# Patient Record
Sex: Female | Born: 1955 | Race: White | Hispanic: No | Smoking: Never smoker
Health system: Southern US, Community
[De-identification: ages and names within clinical notes are randomized; demographics above are authoritative.]

## PROBLEM LIST (undated history)

## (undated) DIAGNOSIS — I10 Essential (primary) hypertension: Secondary | ICD-10-CM

## (undated) DIAGNOSIS — J45909 Unspecified asthma, uncomplicated: Secondary | ICD-10-CM

## (undated) HISTORY — PX: OTHER SURGICAL HISTORY: SHX169

## (undated) HISTORY — PX: TUBAL LIGATION: SHX77

---

## 2016-10-09 ENCOUNTER — Emergency Department: Payer: Medicaid Other

## 2016-10-09 ENCOUNTER — Other Ambulatory Visit: Payer: Self-pay

## 2016-10-09 ENCOUNTER — Emergency Department
Admission: EM | Admit: 2016-10-09 | Discharge: 2016-10-10 | Disposition: A | Discharge status: Home / Self Care | Payer: Medicaid Other | Attending: Emergency Medicine | Admitting: Emergency Medicine

## 2016-10-09 DIAGNOSIS — R21 Rash and other nonspecific skin eruption: Secondary | ICD-10-CM | POA: Diagnosis present

## 2016-10-09 DIAGNOSIS — L03115 Cellulitis of right lower limb: Secondary | ICD-10-CM | POA: Diagnosis not present

## 2016-10-09 DIAGNOSIS — L03116 Cellulitis of left lower limb: Secondary | ICD-10-CM | POA: Diagnosis not present

## 2016-10-09 DIAGNOSIS — L03119 Cellulitis of unspecified part of limb: Secondary | ICD-10-CM

## 2016-10-09 LAB — BASIC METABOLIC PANEL
Anion gap: 11 (ref 5–15)
BUN: 15 mg/dL (ref 6–20)
CALCIUM: 9.4 mg/dL (ref 8.9–10.3)
CO2: 27 mmol/L (ref 22–32)
CREATININE: 0.96 mg/dL (ref 0.44–1.00)
Chloride: 98 mmol/L — ABNORMAL LOW (ref 101–111)
GFR calc non Af Amer: >60 mL/min (ref >60)
Glucose, Bld: 129 mg/dL — ABNORMAL HIGH (ref 65–99)
Potassium: 3.1 mmol/L — ABNORMAL LOW (ref 3.5–5.1)
SODIUM: 136 mmol/L (ref 135–145)

## 2016-10-09 LAB — CBC
HCT: 36.3 % (ref 35.0–47.0)
Hemoglobin: 12.5 g/dL (ref 12.0–16.0)
MCH: 30.8 pg (ref 26.0–34.0)
MCHC: 34.4 g/dL (ref 32.0–36.0)
MCV: 89.5 fL (ref 80.0–100.0)
PLATELETS: 251 10*3/uL (ref 150–440)
RBC: 4.06 MIL/uL (ref 3.80–5.20)
RDW: 13.5 % (ref 11.5–14.5)
WBC: 7.4 10*3/uL (ref 3.6–11.0)

## 2016-10-09 LAB — TROPONIN I

## 2016-10-09 MED ORDER — DEXTROSE 5 % IV SOLN
1.0000 g | Freq: Once | INTRAVENOUS | Status: AC
  Administered 2016-10-09: 1 g via INTRAVENOUS
  Filled 2016-10-09: qty 10

## 2016-10-09 MED ORDER — MORPHINE SULFATE (PF) 2 MG/ML IV SOLN
2.0000 mg | Freq: Once | INTRAVENOUS | Status: AC
  Administered 2016-10-09: 2 mg via INTRAVENOUS
  Filled 2016-10-09: qty 1

## 2016-10-09 NOTE — ED Notes (Signed)
Pt has swelling and redness to both lower legs.  No itching.   Sx for 2 days.  Pt reports unsure if bug bites to ankles.  Pt alert.

## 2016-10-09 NOTE — ED Triage Notes (Signed)
Pt ambulatory to triage with noted limping gait. Pt offered a wheelchair but did not want one. Pt reports she has what she thinks is a rash to her bilateral lower legs that she first noticed yesterday. Pt reports the areas felt warm to the touch yesterday but not now. Pt also reports swelling to her legs. Pt does take a diuretic. Pt now also reporting shortness of breath with exertion and chest pain that is mid sternal with no radiation but causes increased shortness of breath, nausea and diaphoresis when it occurs. Pt talking in full and complete sentences at this time with no distress, Pedal pulses palpable. No pitting edema noted to BLE.

## 2016-10-09 NOTE — ED Notes (Signed)
Report off to allison rn  

## 2016-10-09 NOTE — ED Notes (Signed)
Cellulitis outlined with skin marker. 

## 2016-10-09 NOTE — ED Notes (Signed)
Explained to pt the reason for offering a wheelchair related to her fall risk and now her co of chest pain. Pt agreeable to get in wheelchair at this time. Yellow arm band placed on pt.

## 2016-10-09 NOTE — ED Provider Notes (Addendum)
[facility] Emergency Department Provider Note   First MD Initiated Contact with Patient 10/09/16 2311     (approximate)  I have reviewed the triage vital signs and the nursing notes.   HISTORY  Chief Complaint Rash; Leg Swelling; Knee Pain; and Chest Pain    HPI Christina Guzman is a 61 y.o. female presents to the emergency department with 2 day history of bilateral lower extremity pain swelling and redness noted on the medial aspect of both legs. Patient states that the area was initially warm to touch. Patient denies any fever no nausea or vomiting. Patient denies any chest pain before arrival to the emergency department she also denied any dyspnea. Patient states while in triage when she was unable to find her IV she "became "very anxious" and at that time started experiencing some shortness of breath and chest discomfort which she voiced in triage. Patient states that she has a history of "panic attacks" and states that this was consistent with previous panic attacks. Patient states that following obtaining ID chest pain and shortness of breath resolved.   Past medical history "Enlarged heart" There are no active problems to display for this patient.   Past surgical history None  Prior to Admission medications   Not on File    Allergies Tylenol [acetaminophen]  No family history on file.  Social History Social History  Substance Use Topics  . Smoking status: Not on file  . Smokeless tobacco: Not on file  . Alcohol use Not on file    Review of Systems Constitutional: No fever/chills Eyes: No visual changes. ENT: No sore throat. Cardiovascular: Denies chest pain. Respiratory: Denies shortness of breath. Gastrointestinal: No abdominal pain.  No nausea, no vomiting.  No diarrhea.  No constipation. Genitourinary: Negative for dysuria. Musculoskeletal: Negative for neck pain.  Negative for back pain.positive for bilateral lower external pain and  swelling redness Integumentary: Negative for rash. Neurological: Negative for headaches, focal weakness or numbness.   ____________________________________________   PHYSICAL EXAM:  VITAL SIGNS: ED Triage Vitals  Enc Vitals Group     BP 10/09/16 1950 123/63     Pulse Rate 10/09/16 1950 93     Resp 10/09/16 1950 18     Temp 10/09/16 1950 98.3 F (36.8 C)     Temp Source 10/09/16 1950 Oral     SpO2 10/09/16 1950 98 %     Weight 10/09/16 1951 92.8 kg (204 lb 8 oz)     Height 10/09/16 1951 1.613 m (5' 3.5")     Head Circumference --      Peak Flow --      Pain Score 10/09/16 1950 10     Pain Loc --      Pain Edu? --      Excl. in GC? --     Constitutional: Alert and oriented. Well appearing and in no acute distress. Eyes: Conjunctivae are normal. PERRL. EOMI. Head: Atraumatic. Nose: No congestion/rhinnorhea. Mouth/Throat: Mucous membranes are moist.  Oropharynx non-erythematous. Neck: No stridor Cardiovascular: Normal rate, regular rhythm. Good peripheral circulation. Grossly normal heart sounds. Respiratory: Normal respiratory effort.  No retractions. Lungs CTAB. Gastrointestinal: Soft and nontender. No distention.  Musculoskeletal: No lower extremity tenderness nor edema. No gross deformities of extremities. Neurologic:  Normal speech and language. No gross focal neurologic deficits are appreciated.  Skin:  blanchingerythema noted medial aspect bilateral lower extremity approximate 15 x 8 cm. Area hot to touch Psychiatric: Mood and affect are normal. Speech and  behavior are normal.  ____________________________________________   LABS (all labs ordered are listed, but only abnormal results are displayed)  Labs Reviewed  BASIC METABOLIC PANEL - Abnormal; Notable for the following:       Result Value   Potassium 3.1 (*)    Chloride 98 (*)    Glucose, Bld 129 (*)    All other components within normal limits  CBC  TROPONIN I  TROPONIN I    ____________________________________________  EKG  ED ECG REPORT I, Watson N Christian Treadway, the attending physician, personally viewed and interpreted this ECG.   Date: 10/10/2016  EKG Time: 8:01 PM  Rate: 96  Rhythm: normal sinus rhythm  Axis: right axis deviation  Intervals:normal  ST&T Change: none  ____________________________________________  RADIOLOGY I, [place] N Cadynce Garrette, personally viewed and evaluated these images (plain radiographs) as part of my medical decision making, as well as reviewing the written report by the radiologist.  Dg Chest 2 View  Result Date: 10/09/2016 CLINICAL DATA:  Rash on BILATERAL lower legs since yesterday, swelling, shortness of breath with exertion and midsternal chest pain, nausea and diaphoresis EXAM: CHEST  2 VIEW COMPARISON:  None FINDINGS: Normal heart size, mediastinal contours, and pulmonary vascularity. Mild central peribronchial thickening. Lungs clear. No pulmonary infiltrate, pleural effusion or pneumothorax. No acute osseous findings.Probable BILATERAL prior rotator cuff repairs. IMPRESSION: Bronchitic changes without infiltrate. Electronically Signed   By: Ulyses Southward M.D.   On: 10/09/2016 20:26    Procedures   ____________________________________________   INITIAL IMPRESSION / ASSESSMENT AND PLAN / ED COURSE  Pertinent labs & imaging results that were available during my care of the patient were reviewed by me and considered in my medical decision making (see chart for details).  61 year old female presenting with above stated history with noted cellulitis on the medial aspect of bilateral lower extremity. Patient given 1g IV ceftriaxone in the emergency department will be prescribed Keflex for home.again patient's chest and EKG reveal no evidence of ischemia or infarction troponin negative 2. Patient chest pain-free.      ____________________________________________  FINAL CLINICAL IMPRESSION(S) / ED DIAGNOSES  Final  diagnoses:  Cellulitis of lower extremity, unspecified laterality     MEDICATIONS GIVEN DURING THIS VISIT:  Medications  cefTRIAXone (ROCEPHIN) 1 g in dextrose 5 % 50 mL IVPB (not administered)  morphine 2 MG/ML injection 2 mg (not administered)     NEW OUTPATIENT MEDICATIONS STARTED DURING THIS VISIT:  New Prescriptions   No medications on file    Modified Medications   No medications on file    Discontinued Medications   No medications on file     Note:  This document was prepared using Dragon voice recognition software and may include unintentional dictation errors.    Darci Current, MD 10/10/16 [place]    Darci Current, MD 10/10/16 [phone]

## 2016-10-09 NOTE — ED Notes (Signed)
ED Provider at bedside. 

## 2016-10-10 LAB — TROPONIN I: Troponin I: <0.03 ng/mL (ref <0.03)

## 2016-10-10 MED ORDER — CEPHALEXIN 500 MG PO CAPS: 500.0000 mg | ORAL_CAPSULE | Freq: Two times a day (BID) | ORAL | 0 refills | Status: AC

## 2016-10-10 MED ORDER — KETOROLAC TROMETHAMINE 10 MG PO TABS: 10.0000 mg | ORAL_TABLET | Freq: Four times a day (QID) | ORAL | 0 refills | Status: DC | PRN

## 2016-10-10 NOTE — ED Notes (Signed)

## 2016-11-09 ENCOUNTER — Emergency Department
Admission: EM | Admit: 2016-11-09 | Discharge: 2016-11-09 | Disposition: A | Discharge status: Home / Self Care | Payer: Medicaid Other | Attending: Emergency Medicine | Admitting: Emergency Medicine

## 2016-11-09 ENCOUNTER — Emergency Department: Payer: Medicaid Other

## 2016-11-09 DIAGNOSIS — J111 Influenza due to unidentified influenza virus with other respiratory manifestations: Secondary | ICD-10-CM

## 2016-11-09 DIAGNOSIS — R69 Illness, unspecified: Secondary | ICD-10-CM

## 2016-11-09 DIAGNOSIS — J449 Chronic obstructive pulmonary disease, unspecified: Secondary | ICD-10-CM | POA: Insufficient documentation

## 2016-11-09 DIAGNOSIS — J40 Bronchitis, not specified as acute or chronic: Secondary | ICD-10-CM

## 2016-11-09 DIAGNOSIS — R509 Fever, unspecified: Secondary | ICD-10-CM | POA: Diagnosis present

## 2016-11-09 LAB — BASIC METABOLIC PANEL
ANION GAP: 12 (ref 5–15)
BUN: 18 mg/dL (ref 6–20)
CO2: 26 mmol/L (ref 22–32)
Calcium: 8.9 mg/dL (ref 8.9–10.3)
Chloride: 99 mmol/L — ABNORMAL LOW (ref 101–111)
Creatinine, Ser: 0.84 mg/dL (ref 0.44–1.00)
GFR calc Af Amer: >60 mL/min (ref >60)
GLUCOSE: 110 mg/dL — AB (ref 65–99)
POTASSIUM: 3.5 mmol/L (ref 3.5–5.1)
Sodium: 137 mmol/L (ref 135–145)

## 2016-11-09 LAB — CBC
HEMATOCRIT: 35.7 % (ref 35.0–47.0)
HEMOGLOBIN: 12.7 g/dL (ref 12.0–16.0)
MCH: 31.4 pg (ref 26.0–34.0)
MCHC: 35.5 g/dL (ref 32.0–36.0)
MCV: 88.3 fL (ref 80.0–100.0)
Platelets: 205 10*3/uL (ref 150–440)
RBC: 4.04 MIL/uL (ref 3.80–5.20)
RDW: 13.6 % (ref 11.5–14.5)
WBC: 5.4 10*3/uL (ref 3.6–11.0)

## 2016-11-09 LAB — TROPONIN I: Troponin I: <0.03 ng/mL (ref <0.03)

## 2016-11-09 MED ORDER — ALBUTEROL SULFATE HFA 108 (90 BASE) MCG/ACT IN AERS: 2.0000 | INHALATION_SPRAY | Freq: Four times a day (QID) | RESPIRATORY_TRACT | 0 refills | Status: DC | PRN

## 2016-11-09 MED ORDER — IPRATROPIUM BROMIDE 0.06 % NA SOLN: 2.0000 | Freq: Three times a day (TID) | NASAL | 0 refills | Status: DC

## 2016-11-09 MED ORDER — IPRATROPIUM-ALBUTEROL 0.5-2.5 (3) MG/3ML IN SOLN
3.0000 mL | Freq: Once | RESPIRATORY_TRACT | Status: AC
  Administered 2016-11-09: 3 mL via RESPIRATORY_TRACT
  Filled 2016-11-09: qty 3

## 2016-11-09 MED ORDER — NAPROXEN 500 MG PO TABS
500.0000 mg | ORAL_TABLET | Freq: Once | ORAL | Status: AC
  Administered 2016-11-09: 500 mg via ORAL
  Filled 2016-11-09: qty 1

## 2016-11-09 NOTE — Discharge Instructions (Addendum)
It is normal for you to be sick for a full 7-10 days with the viral illness that you have. Please make sure you remain well-hydrated an use your inhaler as needed for shortness of breath. Take either 600 mg of ibuprofen 3 times a day or 500 mg of naproxen twice a day as needed for muscle aches and fever. Please make an appointment to establish care with a primary care physician within the next week for reevaluation. Return to the emergency department sooner for any concerns such as worsening shortness of breath, if you cannot eat or drink, or for any other concerns whatsoever.  It was a pleasure to take care of you today, and thank you for coming to our emergency department.  If you have any questions or concerns before leaving please ask the nurse to grab me and I'm more than happy to go through your aftercare instructions again.  If you were prescribed any opioid pain medication today such as Norco, Vicodin, Percocet, morphine, hydrocodone, or oxycodone please make sure you do not drive when you are taking this medication as it can alter your ability to drive safely.  If you have any concerns once you are home that you are not improving or are in fact getting worse before you can make it to your follow-up appointment, please do not hesitate to call [phone] and come back for further evaluation.  Merrily Brittle, MD  Results for orders placed or performed during the hospital encounter of 11/09/16  Basic metabolic panel  Result Value Ref Range   Sodium 137 135 - 145 mmol/L   Potassium 3.5 3.5 - 5.1 mmol/L   Chloride 99 (L) 101 - 111 mmol/L   CO2 26 22 - 32 mmol/L   Glucose, Bld 110 (H) 65 - 99 mg/dL   BUN 18 6 - 20 mg/dL   Creatinine, Ser 1.61 0.44 - 1.00 mg/dL   Calcium 8.9 8.9 - 09.6 mg/dL   GFR calc non Af Amer >60 >60 mL/min   GFR calc Af Amer >60 >60 mL/min   Anion gap 12 5 - 15  CBC  Result Value Ref Range   WBC 5.4 3.6 - 11.0 K/uL   RBC 4.04 3.80 - 5.20 MIL/uL   Hemoglobin 12.7 12.0 -  16.0 g/dL   HCT 04.5 40.9 - 81.1 %   MCV 88.3 80.0 - 100.0 fL   MCH 31.4 26.0 - 34.0 pg   MCHC 35.5 32.0 - 36.0 g/dL   RDW 91.4 78.2 - 95.6 %   Platelets 205 150 - 440 K/uL  Troponin I  Result Value Ref Range   Troponin I <0.03 <0.03 ng/mL   Dg Chest 2 View  Result Date: 11/09/2016 CLINICAL DATA:  Chest pain and cough.  Fever. EXAM: CHEST  2 VIEW COMPARISON:  10/09/2016. FINDINGS: Normal sized heart. Poor inspiration with minimal linear atelectasis at both lung bases. Progressive peribronchial thickening. Mild thoracic spine degenerative changes. IMPRESSION: 1. Bronchitic changes, increased. 2. Poor inspiration with minimal bibasilar linear atelectasis. Electronically Signed   By: Beckie Salts M.D.   On: 11/09/2016 14:55

## 2016-11-09 NOTE — ED Triage Notes (Signed)
Pt came to ED via EMS c/o cough and chest pain. Reports fevers, afebrile in triage. Vs stable at this time.

## 2016-11-09 NOTE — ED Provider Notes (Signed)
[facility] Emergency Department Provider Note  ____________________________________________   First MD Initiated Contact with Patient 11/09/16 1403     (approximate)  I have reviewed the triage vital signs and the nursing notes.   HISTORY  Chief Complaint Influenza   HPI Christina Guzman is a 61 y.o. female who self presents to the emergency department with 4 days of fever to 101 at home, malaise, myalgias, rhinorrhea, congestion, and nonproductive cough. She has a past medical history of "swollen heart" although is currently untreated. She has a history of COPD he does not currently smoke. She has several sick contacts at home. Her symptoms began insidiously and is been slowly progressive. She has taken ibuprofen at home with minimal relief. She did not get a flu shot this year.   No past medical history on file.  There are no active problems to display for this patient.   No past surgical history on file.  Prior to Admission medications   Medication Sig Start Date End Date Taking? Authorizing Provider  albuterol (PROVENTIL HFA;VENTOLIN HFA) 108 (90 Base) MCG/ACT inhaler Inhale 2 puffs into the lungs every 6 (six) hours as needed for wheezing or shortness of breath. 11/09/16   Merrily Brittle, MD  ipratropium (ATROVENT) 0.06 % nasal spray Place 2 sprays into the nose 3 (three) times daily. 11/09/16 11/09/17  Merrily Brittle, MD    Allergies Tylenol [acetaminophen]  No family history on file.  Social History Social History  Substance Use Topics  . Smoking status: Not on file  . Smokeless tobacco: Not on file  . Alcohol use Not on file    Review of Systems Constitutional:positive fevers Eyes: No visual changes. ENT: No sore throat. Cardiovascular: positive for chest pain. Respiratory: positive for shortness of breath. Gastrointestinal: No abdominal pain.  No nausea, no vomiting.  No diarrhea.  No constipation. Genitourinary: Negative for  dysuria. Musculoskeletal: Negative for back pain. Skin: Negative for rash. Neurological: Negative for headaches, focal weakness or numbness.   ____________________________________________   PHYSICAL EXAM:  VITAL SIGNS: ED Triage Vitals [11/09/16 1335]  Enc Vitals Group     BP 101/73     Pulse Rate 88     Resp 18     Temp 98 F (36.7 C)     Temp Source Oral     SpO2 98 %     Weight 190 lb (86.2 kg)     Height  (1.6 m)     Head Circumference      Peak Flow      Pain Score      Pain Loc      Pain Edu?      Excl. in GC?     Constitutional: alert and oriented 4 speaks with a nasal congested voice with dry cough during assessment Eyes: PERRL EOMI. Head: Atraumatic. Nose: No congestion/rhinnorhea. Mouth/Throat: No trismus Neck: No stridor.   Cardiovascular: Normal rate, regular rhythm. Grossly normal heart sounds.  Good peripheral circulation. Respiratory: slightly increased respiratory effort with diffuse wheezes in all fields. Moving good air. No rhonchi. Gastrointestinal: soft nontender Musculoskeletal: No lower extremity edema   Neurologic:  Normal speech and language. No gross focal neurologic deficits are appreciated. Skin:  Skin is warm, dry and intact. No rash noted. Psychiatric: Mood and affect are normal. Speech and behavior are normal.    ____________________________________________   DIFFERENTIAL includes but not limited to  influenza, influenza-like illness, bronchitis, pneumonia, pneumothorax, congestive heart failure ____________________________________________   LABS (all  labs ordered are listed, but only abnormal results are displayed)  Labs Reviewed  BASIC METABOLIC PANEL - Abnormal; Notable for the following:       Result Value   Chloride 99 (*)    Glucose, Bld 110 (*)    All other components within normal limits  CBC  TROPONIN I    blood work reviewed interpreted by me shows no acute  ischemia __________________________________________  EKG  ED ECG REPORT I, Merrily Brittle, the attending physician, personally viewed and interpreted this ECG.  Date: 11/09/2016 EKG Time:  Rate: 85 Rhythm: normal sinus rhythm QRS Axis: normal Intervals: normal ST/T Wave abnormalities: normal Narrative Interpretation: no evidence of acute ischemia __________________________________________  RADIOLOGY  chest x-ray reviewed by me shows no acute disease____________________________________________   PROCEDURES  Procedure(s) performed: no  Procedures  Critical Care performed: no  Observation: no ____________________________________________   INITIAL IMPRESSION / ASSESSMENT AND PLAN / ED COURSE  Pertinent labs & imaging results that were available during my care of the patient were reviewed by me and considered in my medical decision making (see chart for details).  the patient arrives afebrile not tachycardic with a clear upper respiratory tract infection although with bilateral lung wheezing now suggestive of bronchitis. Chest x-ray is negative for pneumonia. She certainly could have influenza, however her symptoms are outside of the window for treatment with Tamiflu.     the patient feels improved after second treatment. I will treat her with albuterol for her bronchitis refer her back to primary care. She verbalizes understanding and agreement with plan. ____________________________________________   FINAL CLINICAL IMPRESSION(S) / ED DIAGNOSES  Final diagnoses:  Influenza-like illness  Bronchitis      NEW MEDICATIONS STARTED DURING THIS VISIT:  Discharge Medication List as of 11/09/2016  3:28 PM    START taking these medications   Details  albuterol (PROVENTIL HFA;VENTOLIN HFA) 108 (90 Base) MCG/ACT inhaler Inhale 2 puffs into the lungs every 6 (six) hours as needed for wheezing or shortness of breath., Starting Sun 11/09/2016, Print    ipratropium  (ATROVENT) 0.06 % nasal spray Place 2 sprays into the nose 3 (three) times daily., Starting Sun 11/09/2016, Until Mon 11/09/2017, Print         Note:  This document was prepared using Dragon voice recognition software and may include unintentional dictation errors.     Merrily Brittle, MD 11/09/16 [phone]

## 2016-12-14 ENCOUNTER — Emergency Department: Payer: Medicaid Other

## 2016-12-14 ENCOUNTER — Emergency Department
Admission: EM | Admit: 2016-12-14 | Discharge: 2016-12-15 | Disposition: A | Discharge status: Home / Self Care | Payer: Medicaid Other | Attending: Emergency Medicine | Admitting: Emergency Medicine

## 2016-12-14 ENCOUNTER — Other Ambulatory Visit: Payer: Self-pay

## 2016-12-14 DIAGNOSIS — K219 Gastro-esophageal reflux disease without esophagitis: Secondary | ICD-10-CM | POA: Diagnosis not present

## 2016-12-14 DIAGNOSIS — J45909 Unspecified asthma, uncomplicated: Secondary | ICD-10-CM | POA: Diagnosis not present

## 2016-12-14 DIAGNOSIS — K2100 Gastro-esophageal reflux disease with esophagitis, without bleeding: Secondary | ICD-10-CM

## 2016-12-14 DIAGNOSIS — I10 Essential (primary) hypertension: Secondary | ICD-10-CM | POA: Insufficient documentation

## 2016-12-14 DIAGNOSIS — Z79899 Other long term (current) drug therapy: Secondary | ICD-10-CM | POA: Insufficient documentation

## 2016-12-14 DIAGNOSIS — R079 Chest pain, unspecified: Secondary | ICD-10-CM

## 2016-12-14 HISTORY — DX: Unspecified asthma, uncomplicated: J45.909

## 2016-12-14 HISTORY — DX: Essential (primary) hypertension: I10

## 2016-12-14 LAB — CBC
HCT: 36.8 % (ref 35.0–47.0)
Hemoglobin: 12.4 g/dL (ref 12.0–16.0)
MCH: 30.4 pg (ref 26.0–34.0)
MCHC: 33.7 g/dL (ref 32.0–36.0)
MCV: 90.3 fL (ref 80.0–100.0)
PLATELETS: 241 10*3/uL (ref 150–440)
RBC: 4.08 MIL/uL (ref 3.80–5.20)
RDW: 13.5 % (ref 11.5–14.5)
WBC: 8.1 10*3/uL (ref 3.6–11.0)

## 2016-12-14 LAB — BASIC METABOLIC PANEL
Anion gap: 10 (ref 5–15)
BUN: 19 mg/dL (ref 6–20)
CALCIUM: 9.2 mg/dL (ref 8.9–10.3)
CO2: 25 mmol/L (ref 22–32)
CREATININE: 0.91 mg/dL (ref 0.44–1.00)
Chloride: 104 mmol/L (ref 101–111)
GFR calc Af Amer: >60 mL/min (ref >60)
GFR calc non Af Amer: >60 mL/min (ref >60)
GLUCOSE: 115 mg/dL — AB (ref 65–99)
Potassium: 3.8 mmol/L (ref 3.5–5.1)
Sodium: 139 mmol/L (ref 135–145)

## 2016-12-14 LAB — TROPONIN I

## 2016-12-14 NOTE — ED Notes (Signed)
Patient transported to X-ray via stretcher 

## 2016-12-14 NOTE — ED Notes (Signed)
Pt back from x-ray.

## 2016-12-14 NOTE — ED Triage Notes (Signed)
Pt arrived via EMS from home after sudden onset of sharp stabbing pain to the left side of her chest; pt says pain radiates through to her back; arrived with saturated shirt due to diaphoresis; blood pressure 187/128 upon EMS arrival to her house, report pt was very anxious; was given 1 Nitro and 3-81mg  aspirin; pressure 127/87 before arrival; pt was diagnosed 2 weeks ago for bronchitis and influenza; awake and alert, talking in complete coherent sentences;

## 2016-12-15 ENCOUNTER — Encounter: Payer: Self-pay | Admitting: Radiology

## 2016-12-15 ENCOUNTER — Emergency Department: Payer: Medicaid Other

## 2016-12-15 LAB — HEPATIC FUNCTION PANEL
ALBUMIN: 3.6 g/dL (ref 3.5–5.0)
ALT: 23 U/L (ref 14–54)
AST: 36 U/L (ref 15–41)
Alkaline Phosphatase: 148 U/L — ABNORMAL HIGH (ref 38–126)
Total Bilirubin: 0.6 mg/dL (ref 0.3–1.2)
Total Protein: 7.1 g/dL (ref 6.5–8.1)

## 2016-12-15 LAB — TROPONIN I

## 2016-12-15 LAB — FIBRIN DERIVATIVES D-DIMER (ARMC ONLY): FIBRIN DERIVATIVES D-DIMER (ARMC): 499.69 ng{FEU}/mL — AB (ref 0.00–499.00)

## 2016-12-15 LAB — LIPASE, BLOOD: LIPASE: 28 U/L (ref 11–51)

## 2016-12-15 MED ORDER — GI COCKTAIL ~~LOC~~
30.0000 mL | Freq: Once | ORAL | Status: AC
  Administered 2016-12-15: 30 mL via ORAL
  Filled 2016-12-15: qty 30

## 2016-12-15 MED ORDER — METOCLOPRAMIDE HCL 5 MG/ML IJ SOLN
10.0000 mg | Freq: Once | INTRAMUSCULAR | Status: AC
  Administered 2016-12-15: 10 mg via INTRAVENOUS
  Filled 2016-12-15: qty 2

## 2016-12-15 MED ORDER — IOPAMIDOL (ISOVUE-370) INJECTION 76%
75.0000 mL | Freq: Once | INTRAVENOUS | Status: AC | PRN
  Administered 2016-12-15: 75 mL via INTRAVENOUS

## 2016-12-15 MED ORDER — OMEPRAZOLE 20 MG PO CPDR: 20.0000 mg | DELAYED_RELEASE_CAPSULE | Freq: Two times a day (BID) | ORAL | 0 refills | Status: AC

## 2016-12-15 MED ORDER — SUCRALFATE 1 G PO TABS
1.0000 g | ORAL_TABLET | Freq: Once | ORAL | Status: AC
  Administered 2016-12-15: 1 g via ORAL
  Filled 2016-12-15: qty 1

## 2016-12-15 MED ORDER — SUCRALFATE 1 G PO TABS: 1.0000 g | ORAL_TABLET | Freq: Two times a day (BID) | ORAL | 0 refills | Status: AC

## 2016-12-15 NOTE — ED Provider Notes (Signed)
[facility] Emergency Department Provider Note   ____________________________________________   First MD Initiated Contact with Patient 12/14/16 2349     (approximate)  I have reviewed the triage vital signs and the nursing notes.   HISTORY  Chief Complaint Chest Pain    HPI Christina Guzman is a 61 y.o. female [place] comes into the hospital today with some chest pain. She reports that it sharp and started around 7:30 to 8:00. She reports that she felt some ringing in ears and some shooting stabbing pain in her chest. It started on her left breast and moved into the middle of her chest. The patient reports she got upset and started feeling anxious. She hyperventilated and her blood pressure was elevated. She also had some burning in her chest. The patient was given some nitroglycerin by EMS and the pain did subside a little. She reports that the pain started moving into the back between her shoulder blades. She reports that now her chest is sore but she still rates it a 10 out of 10 in intensity. She's had some nausea with no vomiting and she denies any shortness of breath. The patient reports that she's been having some trouble controlling her reflux up for the past 2 weeks. She's been taking increasing doses of Zantac to help control her heartburn symptoms. The patient did not know what was going on so she decided to come into the hospital today for evaluation.   Past Medical History:  Diagnosis Date  . Asthma   . Hypertension     There are no active problems to display for this patient.   Past Surgical History:  Procedure Laterality Date  . CESAREAN SECTION    . spleen surgery    . TUBAL LIGATION      Prior to Admission medications   Medication Sig Start Date End Date Taking? Authorizing Provider  amitriptyline (ELAVIL) 75 MG tablet Take 75 mg at bedtime as needed by mouth for sleep.   Yes [provider]  busPIRone (BUSPAR) 10 MG tablet Take  10 mg 3 (three) times daily by mouth.   Yes [provider]  carbamazepine (TEGRETOL XR) 200 MG 12 hr tablet Take 200 mg 2 (two) times daily by mouth.   Yes [provider]  chlorthalidone (HYGROTON) 25 MG tablet Take 25 mg daily by mouth.   Yes [provider]  DULoxetine (CYMBALTA) 60 MG capsule Take 60 mg daily by mouth.   Yes [provider]  enalapril (VASOTEC) 10 MG tablet Take 10 mg daily by mouth.   Yes [provider]  gabapentin (NEURONTIN) 300 MG capsule Take 300 mg 3 (three) times daily by mouth.   Yes [provider]  naproxen sodium (ALEVE) 220 MG tablet Take 220 mg 2 (two) times daily as needed by mouth (pain).   Yes [provider]  pravastatin (PRAVACHOL) 10 MG tablet Take 10 mg at bedtime by mouth.   Yes [provider]  QUEtiapine (SEROQUEL XR) 200 MG 24 hr tablet Take 200 mg at bedtime by mouth.   Yes [provider]  ranitidine (ZANTAC) 300 MG tablet Take 300 mg 2 (two) times daily by mouth.   Yes [provider]  omeprazole (PRILOSEC) 20 MG capsule Take 1 capsule (20 mg total) 2 (two) times daily before a meal for 10 days by mouth. 12/15/16 12/25/16  Rebecka ApleyWebster, Jasmane Brockway P, MD  sucralfate (CARAFATE) 1 g tablet Take 1 tablet (1 g total) 2 (two)  times daily by mouth. 12/15/16   Rebecka Apley, MD    Allergies Tylenol [acetaminophen]  No family history on file.  Social History Social History   Tobacco Use  . Smoking status: Never Smoker  Substance Use Topics  . Alcohol use: No    Frequency: Never  . Drug use: Not on file    Review of Systems  Constitutional: No fever/chills Eyes: No visual changes. ENT: No sore throat. Cardiovascular:  chest pain. Respiratory: Denies shortness of breath. Gastrointestinal: Nausea with No abdominal pain, no vomiting.  No diarrhea.  No constipation. Genitourinary: Negative for dysuria. Musculoskeletal: Negative for back pain. Skin: Negative  for rash. Neurological: Negative for headaches, focal weakness or numbness.   ____________________________________________   PHYSICAL EXAM:  VITAL SIGNS: ED Triage Vitals  Enc Vitals Group     BP 12/14/16 2157 (!) 105/54     Pulse Rate 12/14/16 2157 97     Resp 12/14/16 2157 (!) 24     Temp 12/14/16 2157 98.1 F (36.7 C)     Temp Source 12/14/16 2157 Oral     SpO2 12/14/16 2157 96 %     Weight 12/14/16 2158 185 lb (83.9 kg)     Height 12/14/16 2158 5' 3.5" (1.613 m)     Head Circumference --      Peak Flow --      Pain Score --      Pain Loc --      Pain Edu? --      Excl. in GC? --     Constitutional: Alert and oriented. Well appearing and in moderate distress. Eyes: Conjunctivae are normal. PERRL. EOMI. Head: Atraumatic. Nose: No congestion/rhinnorhea. Mouth/Throat: Mucous membranes are moist.  Oropharynx non-erythematous. Cardiovascular: Normal rate, regular rhythm. Grossly normal heart sounds.  Good peripheral circulation. Respiratory: Normal respiratory effort.  No retractions. Lungs CTAB. Gastrointestinal: Soft with some epigastric pain to palpation. No distention. Positive bowel sounds Musculoskeletal: No lower extremity tenderness nor edema.   Neurologic:  Normal speech and language.  Skin:  Skin is warm, dry and intact.  Psychiatric: Mood and affect are normal.   ____________________________________________   LABS (all labs ordered are listed, but only abnormal results are displayed)  Labs Reviewed  BASIC METABOLIC PANEL - Abnormal; Notable for the following components:      Result Value   Glucose, Bld 115 (*)    All other components within normal limits  HEPATIC FUNCTION PANEL - Abnormal; Notable for the following components:   Alkaline Phosphatase 148 (*)    Bilirubin, Direct <0.1 (*)    All other components within normal limits  FIBRIN DERIVATIVES D-DIMER ([facility] ONLY) - Abnormal; Notable for the following components:   Fibrin derivatives D-dimer  [facility]) 499.69 (*)    All other components within normal limits  CBC  TROPONIN I  LIPASE, BLOOD  TROPONIN I   ____________________________________________  EKG  ED ECG REPORT I, Rebecka Apley, the attending physician, personally viewed and interpreted this ECG.   Date: 12/14/2016  EKG Time: 2154  Rate: 94  Rhythm: normal sinus rhythm  Axis: normal  Intervals:none  ST&T Change: none  ____________________________________________  RADIOLOGY  Dg Chest 2 View  Result Date: 12/14/2016 CLINICAL DATA:  Stabbing central chest pain.  Nausea. EXAM: CHEST  2 VIEW COMPARISON:  11/09/2016 FINDINGS: Shallow inspiration with atelectasis in the lung bases. Heart size and pulmonary vascularity are normal. No consolidation in the lungs. No blunting of costophrenic angles. No pneumothorax. Postoperative changes  in both shoulders. Degenerative changes in the spine. IMPRESSION: Shallow inspiration with linear atelectasis in the lung bases. No evidence of active pulmonary disease. Electronically Signed   By: Burman Nieves M.D.   On: 12/14/2016 22:58   Ct Angio Chest Aorta W And/or Wo Contrast  Result Date: 12/15/2016 CLINICAL DATA:  Chest and back pain EXAM: CT ANGIOGRAPHY CHEST WITH CONTRAST TECHNIQUE: Multidetector CT imaging of the chest was performed using the standard protocol during bolus administration of intravenous contrast. Multiplanar CT image reconstructions and MIPs were obtained to evaluate the vascular anatomy. CONTRAST:  75 mL Isovue 370 intravenous COMPARISON:  Radiograph 12/14/2016 FINDINGS: Cardiovascular: Non contrasted images of the chest demonstrate no intramural hematoma. Mild atherosclerotic calcification. No aneurysmal dilatation. No dissection. Normal heart size. No pericardial effusion Mediastinum/Nodes: Midline trachea. No thyroid mass. No significant mediastinal adenopathy. Esophagus within normal limits. Lungs/Pleura: Lungs are clear. No pleural effusion or  pneumothorax. Upper Abdomen: No acute abnormality. Musculoskeletal: Degenerative changes. No acute or suspicious bone lesion Review of the MIP images confirms the above findings. IMPRESSION: 1. Negative for acute aortic dissection 2. Clear lung fields Aortic Atherosclerosis (ICD10-I70.0). Electronically Signed   By: Jasmine Pang M.D.   On: 12/15/2016 03:32    ____________________________________________   PROCEDURES  Procedure(s) performed: None  Procedures  Critical Care performed: No  ____________________________________________   INITIAL IMPRESSION / ASSESSMENT AND PLAN / ED COURSE  As part of my medical decision making, I reviewed the following data within the electronic MEDICAL RECORD NUMBER Notes from prior ED visits and [place] Controlled Substance Database   This is a 61 year old female who comes into the hospital today with some chest pain. The patient was still having pain when I arrived. I did give her a GI cocktail with some Carafate given her recent struggles with reflux. The patient also had received some nitroglycerin but it helped to take away the sharp pain. the patient also received a dose of Reglan.  My differential diagnosis includes acute coronary syndrome, reflux, aortic dissection with the pain radiating to the back.  I did send a lipase and a d-dimer. The d-dimer was mildly elevated sauce at the patient for CT angios of her chest looking for dissection. The patient's CT scan was unremarkable. I also repeated the patient's troponin was negative. After the medication the patient states that the pain was improved. She'll be discharged home to follow-up with the GI physician for further evaluation of her pain.      ____________________________________________   FINAL CLINICAL IMPRESSION(S) / ED DIAGNOSES  Final diagnoses:  Nonspecific chest pain  Gastroesophageal reflux disease with esophagitis      Note:  This document was prepared using Dragon voice  recognition software and may include unintentional dictation errors.    Rebecka Apley, MD 12/15/16 [phone]

## 2016-12-15 NOTE — ED Notes (Signed)
Patient is attempting to contact family to pick her up but is unable to contact either of her contacts.  She is fine with waiting in the lobby and re-attempting contact with family.

## 2016-12-15 NOTE — ED Notes (Signed)
CT contacted to take patient for scan, required IV in place

## 2016-12-15 NOTE — Discharge Instructions (Signed)
Please follow up with the acute care clinic so that you can see cardiology and please follow up with GI

## 2017-11-20 IMAGING — CT CT ANGIO CHEST
4 of 7 series · 18 of 46 positions shown · IV contrast (isovue)
Comparison: Radiograph 12/14/2016

CLINICAL DATA: Chest and back pain

EXAM:
CT ANGIOGRAPHY CHEST WITH CONTRAST
TECHNIQUE: Multidetector CT imaging of the chest was performed using the
standard protocol during bolus administration of intravenous
contrast. Multiplanar CT image reconstructions and MIPs were
obtained to evaluate the vascular anatomy.
CONTRAST:  75 mL Isovue 370 intravenous

[Series 3: axial pre · axial · non-contrast · 0.68mm/px · z∈[-199,-24]mm · 5 of 53 slices shown]
[im 9/53  lung]
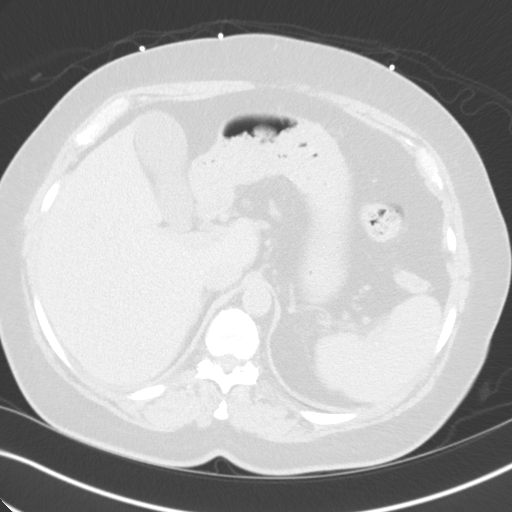
[im 18/53  lung]
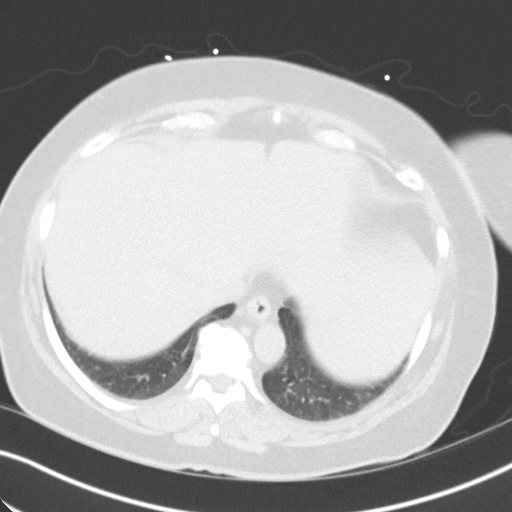
[im 27/53  lung]
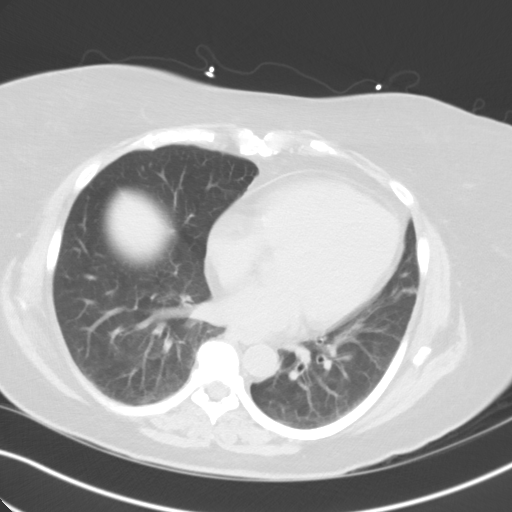
[im 35/53  lung]
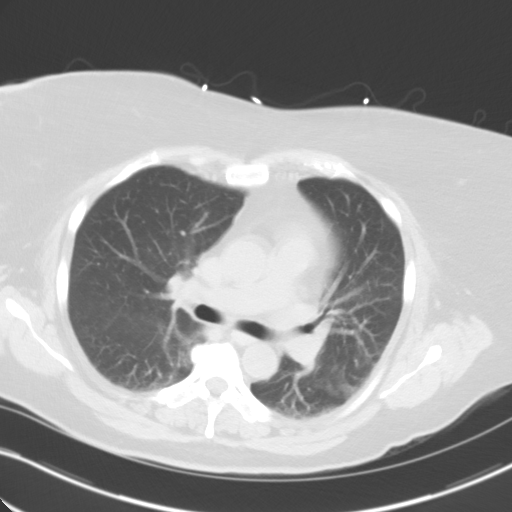
[im 44/53  lung]
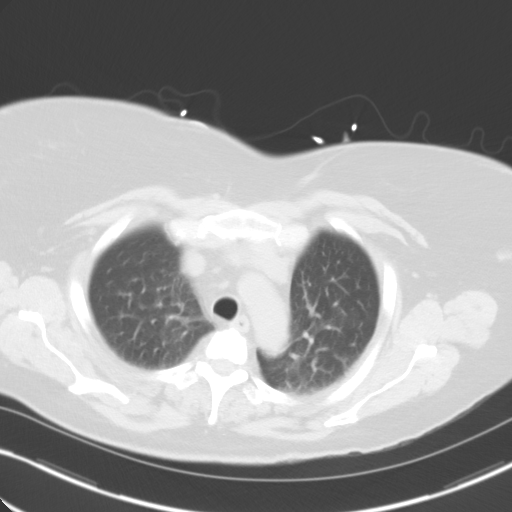

[Series 6: axial arterial · axial · arterial · 0.68mm/px · z∈[-216,-9]mm · 8 of 89 slices shown]
[im 10/89  lung]
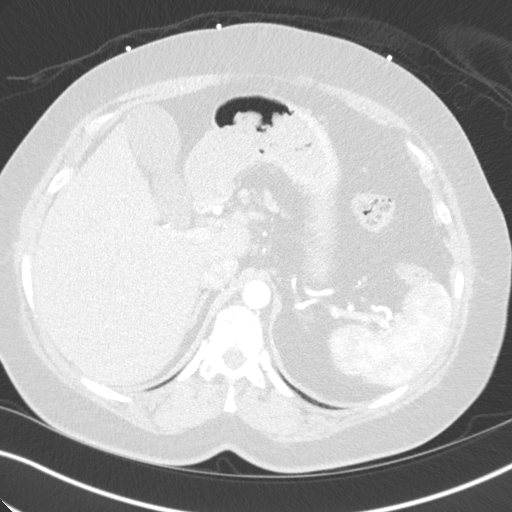
[im 20/89  soft-tissue]
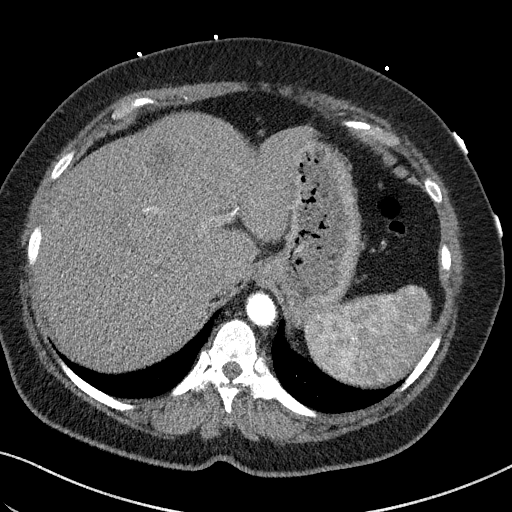
[im 30/89  lung]
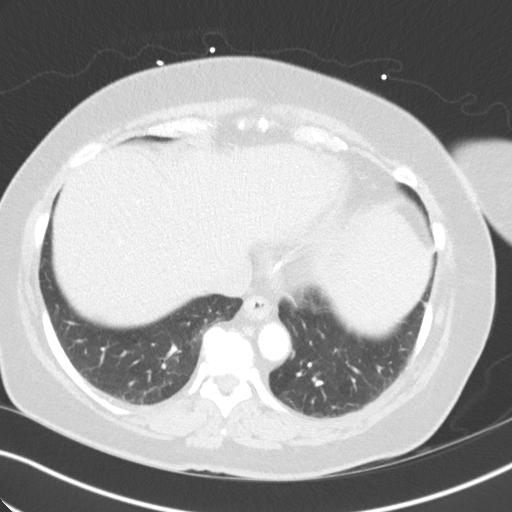
[im 40/89  soft-tissue]
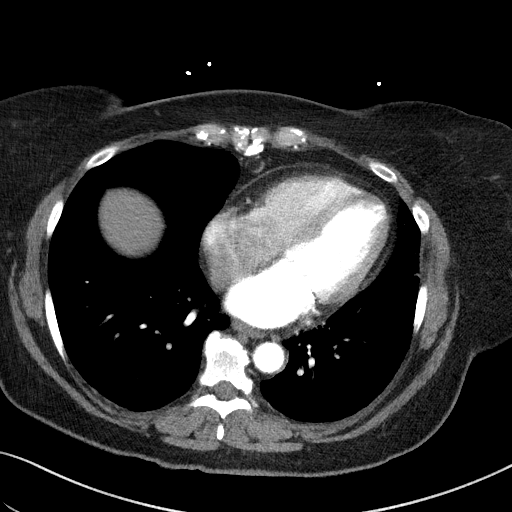
[im 49/89  lung]
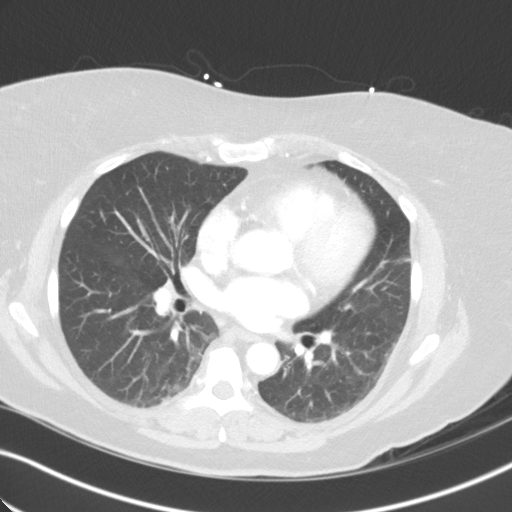
[im 59/89  soft-tissue]
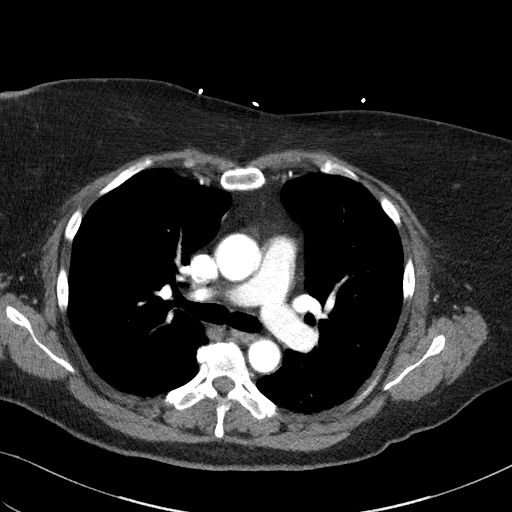
[im 69/89  lung]
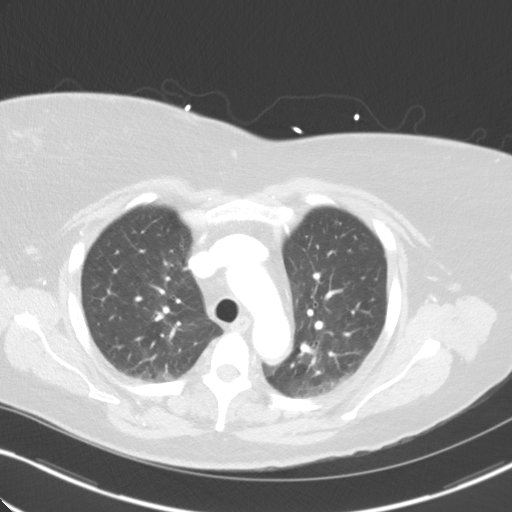
[im 79/89  soft-tissue]
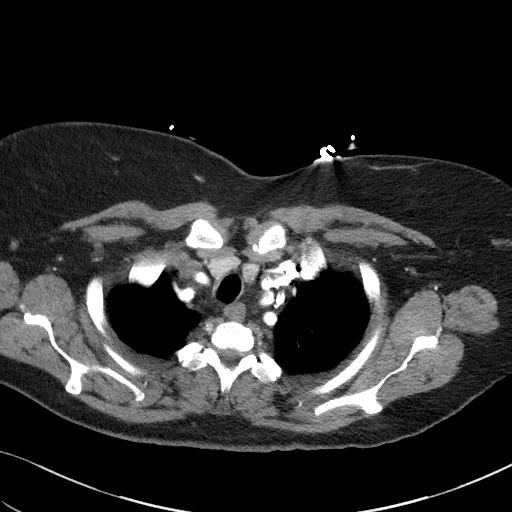

[Series 7: lung · axial · 0.68mm/px · z∈[-225,-187]mm · 2 of 133 slices shown]
[im 10/133  soft-tissue]
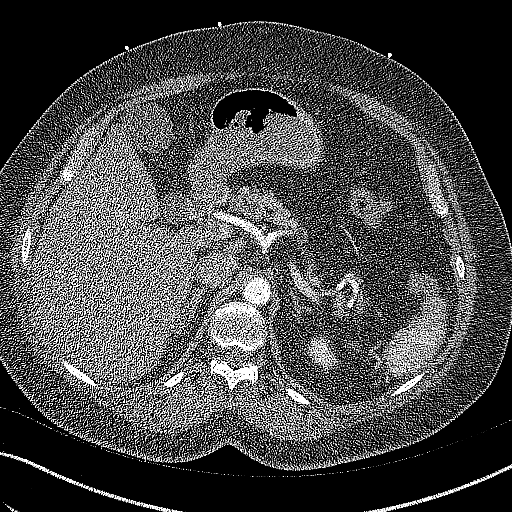
[im 29/133  soft-tissue]
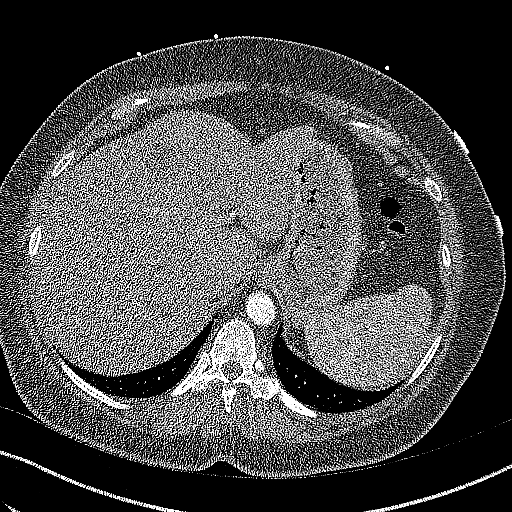

[Series 8: coronals · coronal · 0.52mm/px · 3 of 147 slices shown]
[im 37/147  soft-tissue]
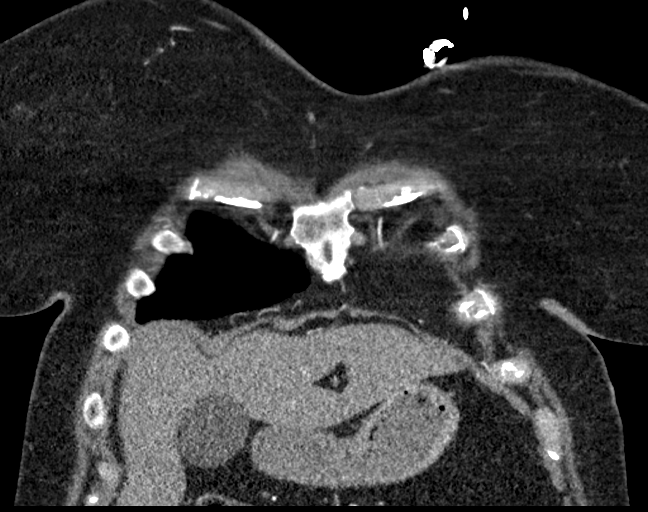
[im 74/147  soft-tissue]
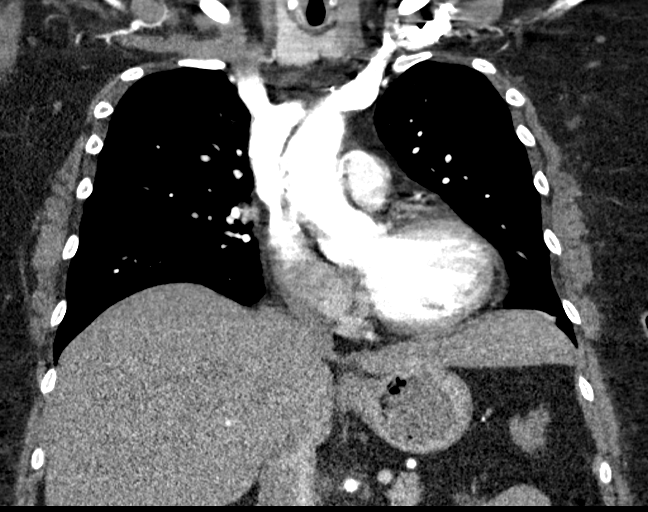
[im 110/147  soft-tissue]
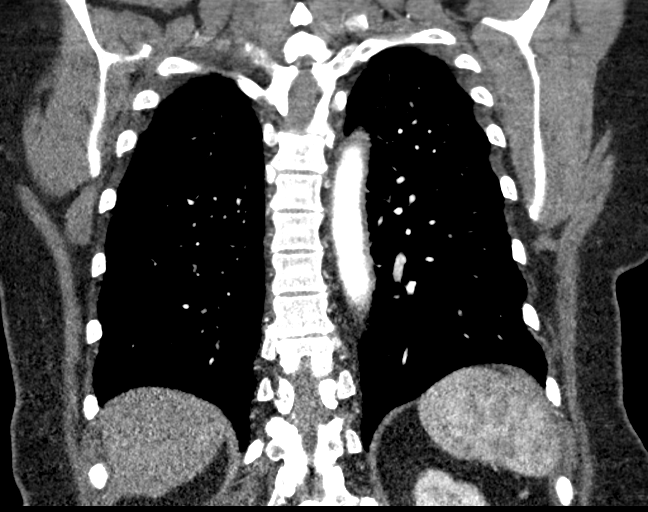

[18 of 46 positions shown; findings below may reference images not displayed]

FINDINGS: Cardiovascular: Non contrasted images of the chest demonstrate no
intramural hematoma. Mild atherosclerotic calcification. No
aneurysmal dilatation. No dissection. Normal heart size. No
pericardial effusion

Mediastinum/Nodes: Midline trachea. No thyroid mass. No significant
mediastinal adenopathy. Esophagus within normal limits.

Lungs/Pleura: Lungs are clear. No pleural effusion or pneumothorax.

Upper Abdomen: No acute abnormality.

Musculoskeletal: Degenerative changes. No acute or suspicious bone
lesion

Review of the MIP images confirms the above findings.
IMPRESSION: 1. Negative for acute aortic dissection
2. Clear lung fields

Aortic Atherosclerosis ([id number]).
